# Patient Record
Sex: Male | Born: 1955 | Race: White | Hispanic: No | Marital: Single | Smoking: Never smoker
Health system: Southern US, Community
[De-identification: ages and names within clinical notes are randomized; demographics above are authoritative.]

## PROBLEM LIST (undated history)

## (undated) DIAGNOSIS — M069 Rheumatoid arthritis, unspecified: Secondary | ICD-10-CM

---

## 2010-06-16 ENCOUNTER — Ambulatory Visit: Payer: Self-pay

## 2012-08-17 IMAGING — CR DG KNEE 1-2V*R*
1 series · 2 of 2 positions shown · non-contrast
Comparison: none

REASON FOR EXAM: use of equipment Fax report to [REDACTED]
COMMENTS:

PROCEDURE:     DXR - DXR KNEE RIGHT AP AND LATERAL  - June 16, 2010  [DATE]
RESULT:     Images of the right knee demonstrate no evidence of fracture,
dislocation or radiopaque foreign body. Some mild medial and patellofemoral
joint space narrowing is present.

[Series 1: view not recorded · 0.17mm/px · 2 of 2 slices shown]
[im 1/2]
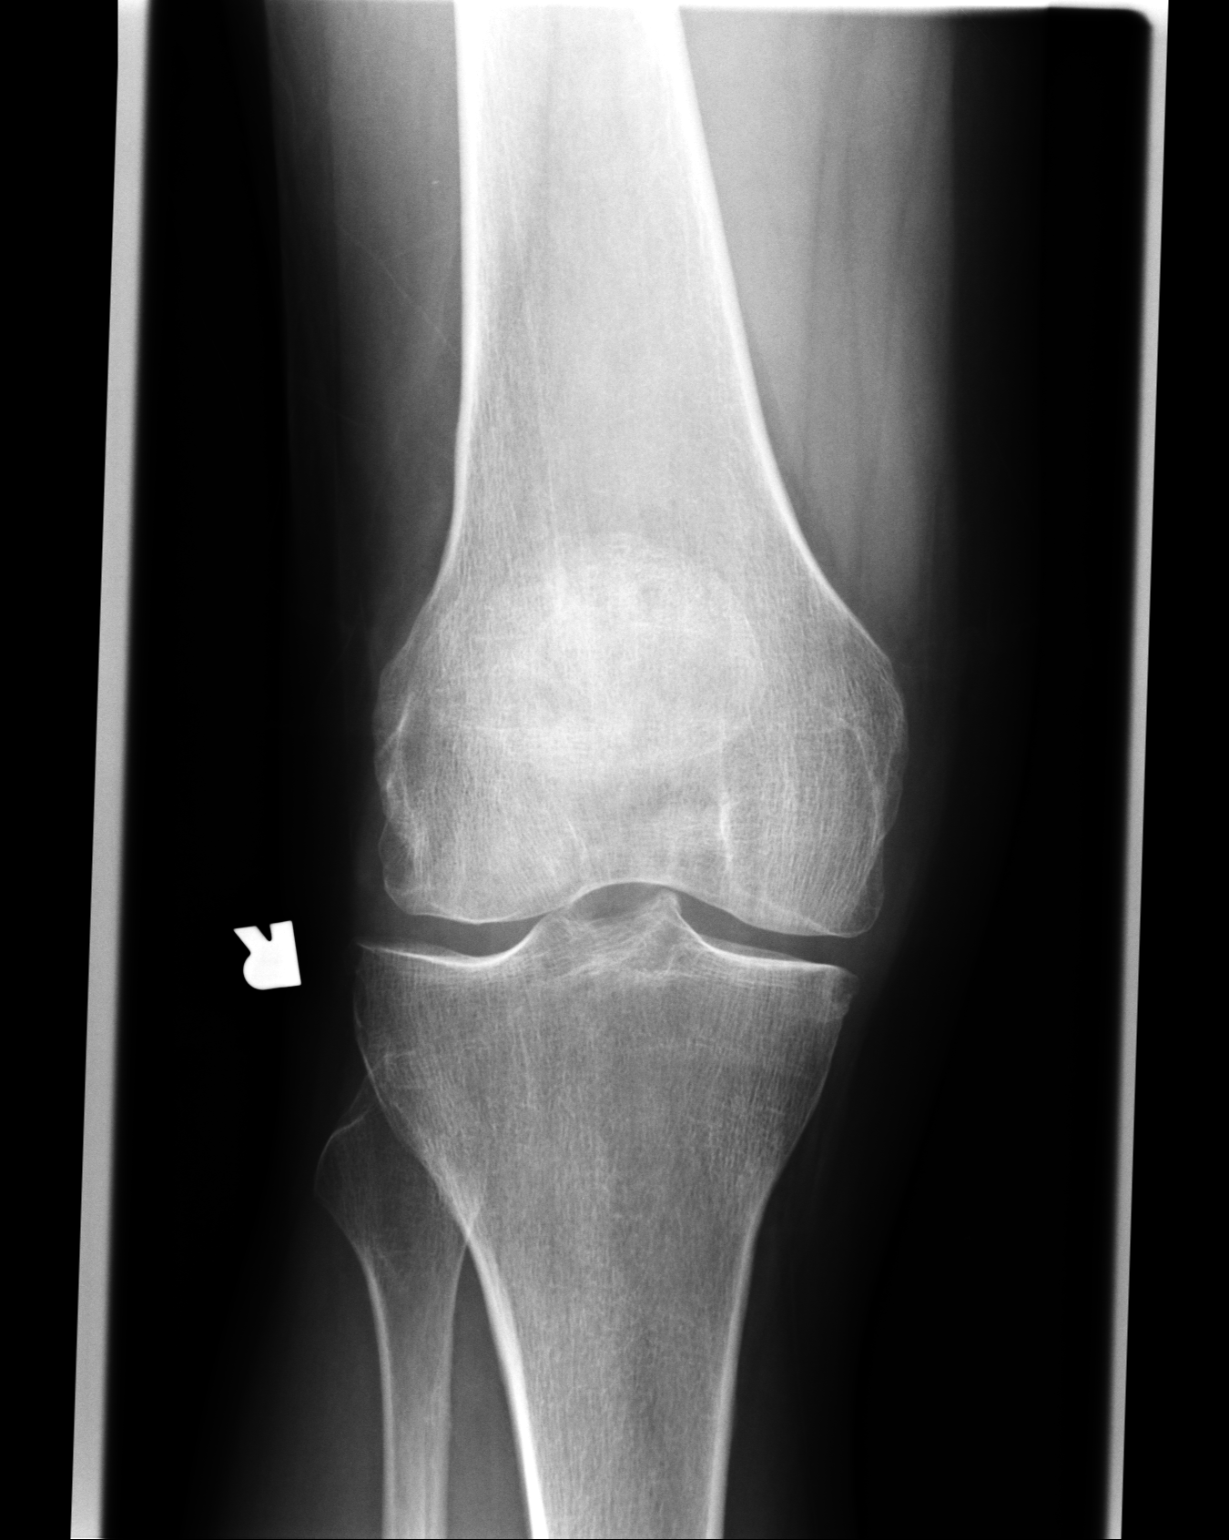
[im 2/2]
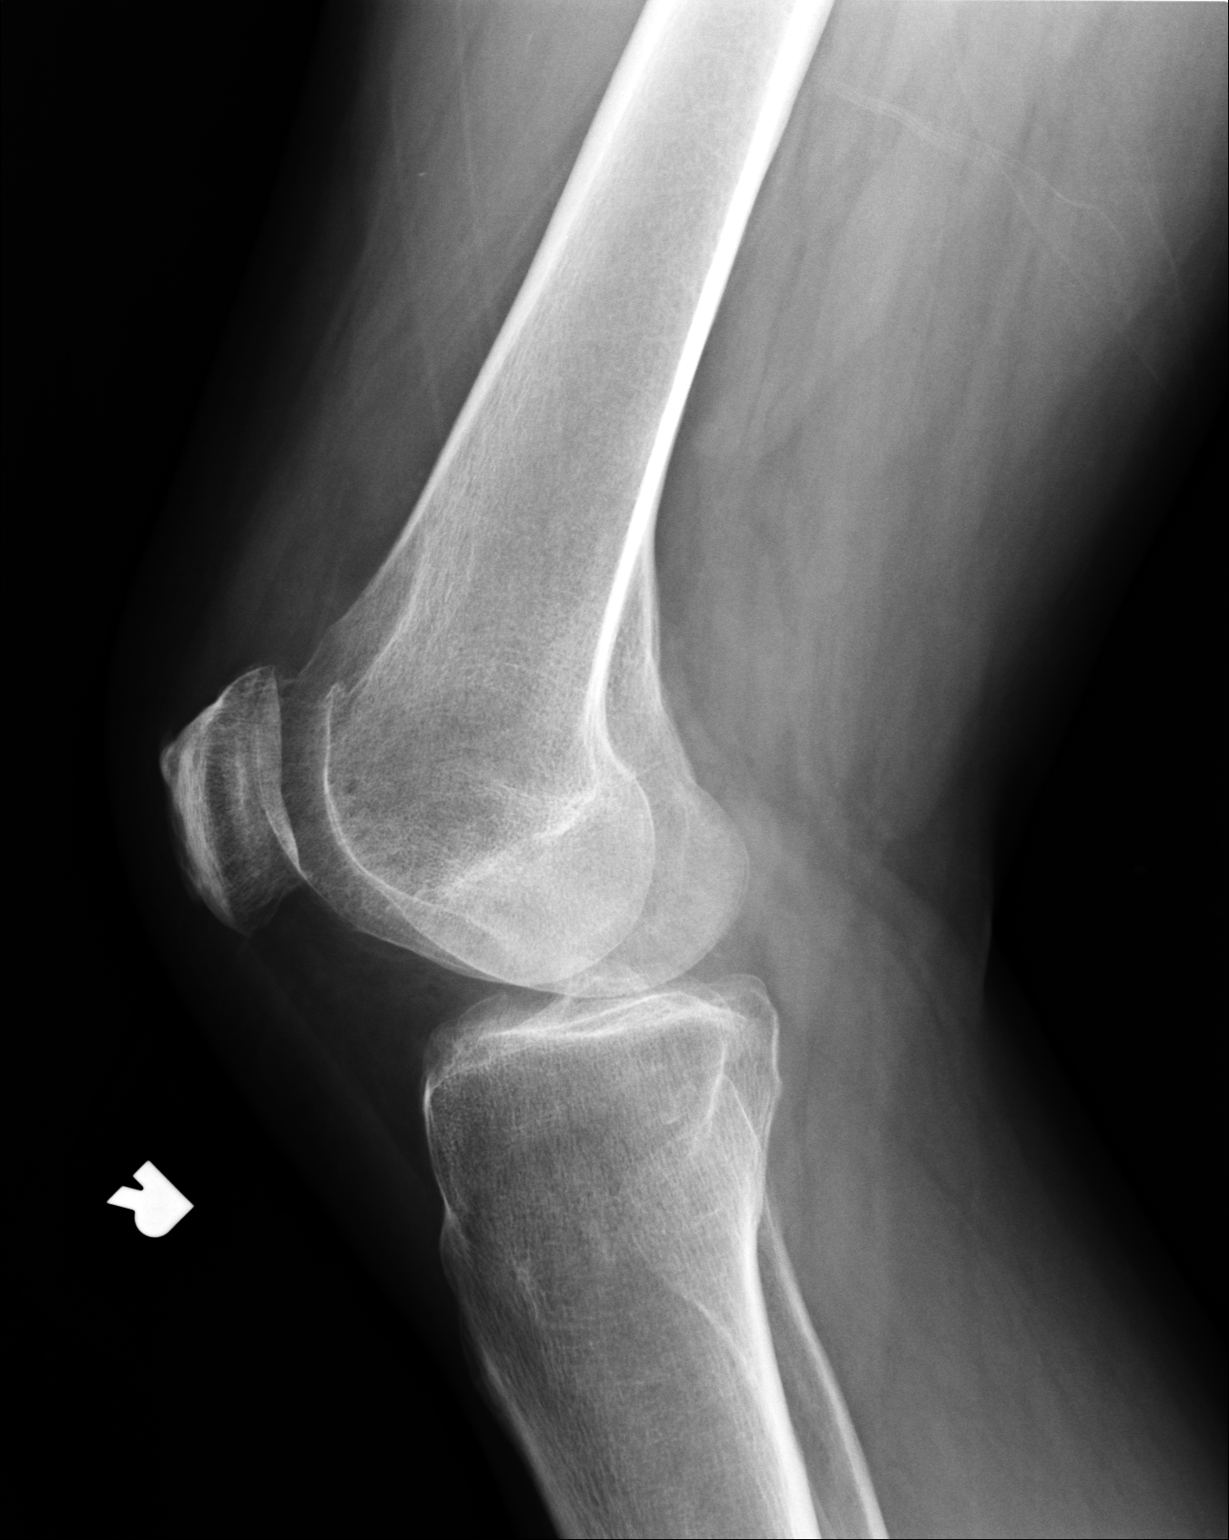

[2 of 2 positions shown; findings below may reference images not displayed]

IMPRESSION: Degenerative change. No acute bony abnormality evident.

## 2021-06-15 ENCOUNTER — Other Ambulatory Visit: Payer: Self-pay

## 2021-06-15 ENCOUNTER — Encounter (HOSPITAL_COMMUNITY): Payer: Self-pay | Admitting: *Deleted

## 2021-06-15 ENCOUNTER — Emergency Department (HOSPITAL_COMMUNITY)
Admission: EM | Admit: 2021-06-15 | Discharge: 2021-06-15 | Disposition: A | Discharge status: Home / Self Care | Payer: Medicare Other | Attending: Emergency Medicine | Admitting: Emergency Medicine

## 2021-06-15 ENCOUNTER — Emergency Department (HOSPITAL_COMMUNITY): Payer: Medicare Other

## 2021-06-15 DIAGNOSIS — Z20822 Contact with and (suspected) exposure to covid-19: Secondary | ICD-10-CM | POA: Diagnosis not present

## 2021-06-15 DIAGNOSIS — U071 COVID-19: Secondary | ICD-10-CM

## 2021-06-15 DIAGNOSIS — R109 Unspecified abdominal pain: Secondary | ICD-10-CM | POA: Insufficient documentation

## 2021-06-15 DIAGNOSIS — R059 Cough, unspecified: Secondary | ICD-10-CM | POA: Diagnosis present

## 2021-06-15 HISTORY — DX: Rheumatoid arthritis, unspecified: M06.9

## 2021-06-15 LAB — CBC WITH DIFFERENTIAL/PLATELET
Abs Immature Granulocytes: 0.01 10*3/uL (ref 0.00–0.07)
Basophils Absolute: 0 10*3/uL (ref 0.0–0.1)
Basophils Relative: 0 %
Eosinophils Absolute: 0 10*3/uL (ref 0.0–0.5)
Eosinophils Relative: 0 %
HCT: 44.3 % (ref 39.0–52.0)
Hemoglobin: 15.2 g/dL (ref 13.0–17.0)
Immature Granulocytes: 0 %
Lymphocytes Relative: 11 %
Lymphs Abs: 0.7 10*3/uL (ref 0.7–4.0)
MCH: 29.4 pg (ref 26.0–34.0)
MCHC: 34.3 g/dL (ref 30.0–36.0)
MCV: 85.7 fL (ref 80.0–100.0)
Monocytes Absolute: 0.5 10*3/uL (ref 0.1–1.0)
Monocytes Relative: 7 %
Neutro Abs: 5.1 10*3/uL (ref 1.7–7.7)
Neutrophils Relative %: 82 %
Platelets: 166 10*3/uL (ref 150–400)
RBC: 5.17 MIL/uL (ref 4.22–5.81)
RDW: 14 % (ref 11.5–15.5)
WBC: 6.2 10*3/uL (ref 4.0–10.5)
nRBC: 0.3 % — ABNORMAL HIGH (ref 0.0–0.2)

## 2021-06-15 LAB — LIPASE, BLOOD: Lipase: 22 U/L (ref 11–51)

## 2021-06-15 LAB — COMPREHENSIVE METABOLIC PANEL
ALT: 23 U/L (ref 0–44)
AST: 21 U/L (ref 15–41)
Albumin: 4.7 g/dL (ref 3.5–5.0)
Alkaline Phosphatase: 96 U/L (ref 38–126)
Anion gap: 11 (ref 5–15)
BUN: 11 mg/dL (ref 8–23)
CO2: 28 mmol/L (ref 22–32)
Calcium: 9.1 mg/dL (ref 8.9–10.3)
Chloride: 98 mmol/L (ref 98–111)
Creatinine, Ser: 0.66 mg/dL (ref 0.61–1.24)
GFR, Estimated: >60 mL/min (ref >60)
Glucose, Bld: 195 mg/dL — ABNORMAL HIGH (ref 70–99)
Potassium: 3.7 mmol/L (ref 3.5–5.1)
Sodium: 137 mmol/L (ref 135–145)
Total Bilirubin: 0.8 mg/dL (ref 0.3–1.2)
Total Protein: 8 g/dL (ref 6.5–8.1)

## 2021-06-15 LAB — GROUP A STREP BY PCR: Group A Strep by PCR: NOT DETECTED

## 2021-06-15 LAB — RESP PANEL BY RT-PCR (FLU A&B, COVID) ARPGX2
Influenza A by PCR: NEGATIVE
Influenza B by PCR: NEGATIVE
SARS Coronavirus 2 by RT PCR: POSITIVE — AB

## 2021-06-15 MED ORDER — ONDANSETRON HCL 4 MG/2ML IJ SOLN
4.0000 mg | Freq: Once | INTRAMUSCULAR | Status: AC
  Administered 2021-06-15: 10:00:00 4 mg via INTRAVENOUS
  Filled 2021-06-15: qty 2

## 2021-06-15 MED ORDER — NIRMATRELVIR/RITONAVIR (PAXLOVID)TABLET: 3.0000 | ORAL_TABLET | Freq: Two times a day (BID) | ORAL | 0 refills | Status: AC

## 2021-06-15 MED ORDER — HYDROMORPHONE HCL 1 MG/ML IJ SOLN
1.0000 mg | Freq: Once | INTRAMUSCULAR | Status: AC
  Administered 2021-06-15: 10:00:00 1 mg via INTRAVENOUS
  Filled 2021-06-15: qty 1

## 2021-06-15 MED ORDER — SODIUM CHLORIDE 0.9 % IV BOLUS
1000.0000 mL | Freq: Once | INTRAVENOUS | Status: AC
  Administered 2021-06-15: 10:00:00 1000 mL via INTRAVENOUS

## 2021-06-15 NOTE — Discharge Instructions (Signed)
You were seen in the emergency department for evaluation of headache body aches cough sore throat.  You tested positive for COVID.  Your flu and strep test were negative.  Your chest x-ray did not show any obvious pneumonia and your blood work did not show significant abnormalities.  We are prescribing you monoclonal antibody which may help lessen your symptoms.  Please follow-up with your regular doctor and return to the emergency department if any worsening or concerning symptoms

## 2021-06-15 NOTE — ED Triage Notes (Signed)
Pt c/o abdominal pain, back pain, fever, productive cough, runny nose, sore throat x couple of days.

## 2021-06-15 NOTE — ED Provider Notes (Signed)
[facility] EMERGENCY DEPARTMENT Provider Note   CSN: [id number] Arrival date & time: 06/15/21  [id number]     History Chief Complaint  Patient presents with   Cough    Matthew Riddle is a 65 y.o. male.  He is here with complaint of feeling sick for a couple of days.  Headache sore throat back and abdominal pain cough runny nose decreased appetite.  Feeling hot and cold.  No sick contacts or recent travel.  History of rheumatoid arthritis on chronic pain medicine.  The history is provided by the patient.  Cough Cough characteristics:  Productive Sputum characteristics:  Yellow Severity:  Moderate Onset quality:  Gradual Duration:  2 days Timing:  Intermittent Progression:  Unchanged Chronicity:  New Smoker: no   Relieved by:  None tried Worsened by:  Nothing Ineffective treatments:  None tried Associated symptoms: chills, fever, headaches, myalgias, rhinorrhea, shortness of breath and sore throat   Associated symptoms: no chest pain and no rash   Risk factors: no recent infection and no recent travel       Past Medical History:  Diagnosis Date   Rheumatoid arthritis ([place])     There are no problems to display for this patient.   Past Surgical History:  Procedure Laterality Date   FINGER FRACTURE SURGERY Left    fourth finger       No family history on file.  Social History   Tobacco Use   Smoking status: Never   Smokeless tobacco: Never  Vaping Use   Vaping Use: Never used  Substance Use Topics   Alcohol use: Yes    Comment: occasionally    Home Medications Prior to Admission medications   Not on File    Allergies    Penicillins  Review of Systems   Review of Systems  Constitutional:  Positive for chills and fever.  HENT:  Positive for rhinorrhea and sore throat.   Eyes:  Negative for visual disturbance.  Respiratory:  Positive for cough and shortness of breath.   Cardiovascular:  Negative for chest pain.  Gastrointestinal:  Positive for  abdominal pain and nausea. Negative for diarrhea and vomiting.  Genitourinary:  Negative for dysuria.  Musculoskeletal:  Positive for back pain and myalgias.  Skin:  Negative for rash.  Neurological:  Positive for headaches.  All other systems reviewed and are negative.  Physical Exam Updated Vital Signs BP (!) 149/78    Pulse 84    Resp 17    Ht 6' (1.829 m)    Wt 99.8 kg    SpO2 100%    BMI 29.84 kg/m   Physical Exam Vitals and nursing note reviewed.  Constitutional:      General: He is not in acute distress.    Appearance: Normal appearance. He is well-developed.  HENT:     Head: Normocephalic and atraumatic.  Eyes:     Conjunctiva/sclera: Conjunctivae normal.  Cardiovascular:     Rate and Rhythm: Normal rate and regular rhythm.     Heart sounds: No murmur heard. Pulmonary:     Effort: Pulmonary effort is normal. No respiratory distress.     Breath sounds: Normal breath sounds.  Abdominal:     Palpations: Abdomen is soft.     Tenderness: There is no abdominal tenderness. There is no guarding or rebound.  Musculoskeletal:        General: No swelling.     Cervical back: Neck supple.     Right lower leg: No edema.  Left lower leg: No edema.  Skin:    General: Skin is warm and dry.     Capillary Refill: Capillary refill takes less than 2 seconds.  Neurological:     General: No focal deficit present.     Mental Status: He is alert.  Psychiatric:        Mood and Affect: Mood normal.    ED Results / Procedures / Treatments   Labs (all labs ordered are listed, but only abnormal results are displayed) Labs Reviewed  RESP PANEL BY RT-PCR (FLU A&B, COVID) ARPGX2 - Abnormal; Notable for the following components:      Result Value   SARS Coronavirus 2 by RT PCR POSITIVE (*)    All other components within normal limits  COMPREHENSIVE METABOLIC PANEL - Abnormal; Notable for the following components:   Glucose, Bld 195 (*)    All other components within normal limits   CBC WITH DIFFERENTIAL/PLATELET - Abnormal; Notable for the following components:   nRBC 0.3 (*)    All other components within normal limits  GROUP A STREP BY PCR  LIPASE, BLOOD    EKG EKG Interpretation  Date/Time:  Sunday June 15 2021 10:16:28 EST Ventricular Rate:  85 PR Interval:  143 QRS Duration: 111 QT Interval:  388 QTC Calculation: 462 R Axis:   -64 Text Interpretation: Sinus rhythm Left anterior fascicular block Abnormal R-wave progression, early transition Left ventricular hypertrophy No old tracing to compare Confirmed by Aletta Edouard [phone]) on 06/15/2021 10:21:02 AM  Radiology DG Chest Port 1 View  Result Date: 06/15/2021 CLINICAL DATA:  Cough, abdominal pain, back pain.  Fever. EXAM: PORTABLE CHEST 1 VIEW COMPARISON:  None. FINDINGS: The heart size and mediastinal contours are within normal limits. Both lungs are clear. The visualized skeletal structures are unremarkable. IMPRESSION: No focal consolidation or pleural effusion. Electronically Signed   By: Keane Police D.O.   On: 06/15/2021 10:51    Procedures Procedures   Medications Ordered in ED Medications  sodium chloride 0.9 % bolus 1,000 mL (has no administration in time range)  ondansetron (ZOFRAN) injection 4 mg (has no administration in time range)  HYDROmorphone (DILAUDID) injection 1 mg (has no administration in time range)    ED Course  I have reviewed the triage vital signs and the nursing notes.  Pertinent labs & imaging results that were available during my care of the patient were reviewed by me and considered in my medical decision making (see chart for details).  Clinical Course as of 06/15/21 1726  Sun Jun 15, 2021  1043 Chest x-ray interpreted by me as no clear infiltrate.  Awaiting radiology reading. [MB]  1148 Reviewed patient's home medications with pharmacist who did not identify any contraindications to treating patient with Paxlovid [MB]    Clinical Course User Index [MB]  Hayden Rasmussen, MD   MDM Rules/Calculators/A&P                         Matthew Riddle was evaluated in Emergency Department on 06/15/2021 for the symptoms described in the history of present illness. He was evaluated in the context of the global COVID-19 pandemic, which necessitated consideration that the patient might be at risk for infection with the SARS-CoV-2 virus that causes COVID-19. Institutional protocols and algorithms that pertain to the evaluation of patients at risk for COVID-19 are in a state of rapid change based on information released by regulatory bodies including the CDC and federal  and state organizations. These policies and algorithms were followed during the patient's care in the ED.  This patient complains of headache body aches sore throat cough decreased appetite abdominal pain; this involves an extensive number of treatment Options and is a complaint that carries with it a high risk of complications and Morbidity. The differential includes COVID, flu, pneumonia, bronchitis, dehydration, strep throat  I ordered, reviewed and interpreted labs, which included CBC with normal white count normal hemoglobin, chemistries normal other than elevated glucose, LFTs normal, strep negative, COVID-positive flu negative I ordered medication IV fluids IV pain medication and nausea medication I ordered imaging studies which included chest x-ray and I independently    visualized and interpreted imaging which showed no acute findings Additional history obtained from patient's family member Previous records obtained and reviewed in epic no recent admissions I consulted pharmacy and discussed lab and imaging findings  Critical Interventions: None  After the interventions stated above, I reevaluated the patient and found patient to be symptomatically improved.  He is otherwise hemodynamically stable and does not require admission at this time.  Will prescribe paxlovid.  Return  instructions discussed     Final Clinical Impression(s) / ED Diagnoses Final diagnoses:  COVID-19 virus infection    Rx / DC Orders ED Discharge Orders     None        Terrilee Files, MD 06/15/21 1728

## 2023-08-17 IMAGING — DX DG CHEST 1V PORT
1 series · 1 of 1 positions shown · non-contrast
Comparison: None.

CLINICAL DATA: Cough, abdominal pain, back pain.  Fever.

EXAM:
PORTABLE CHEST 1 VIEW

[chest ap]
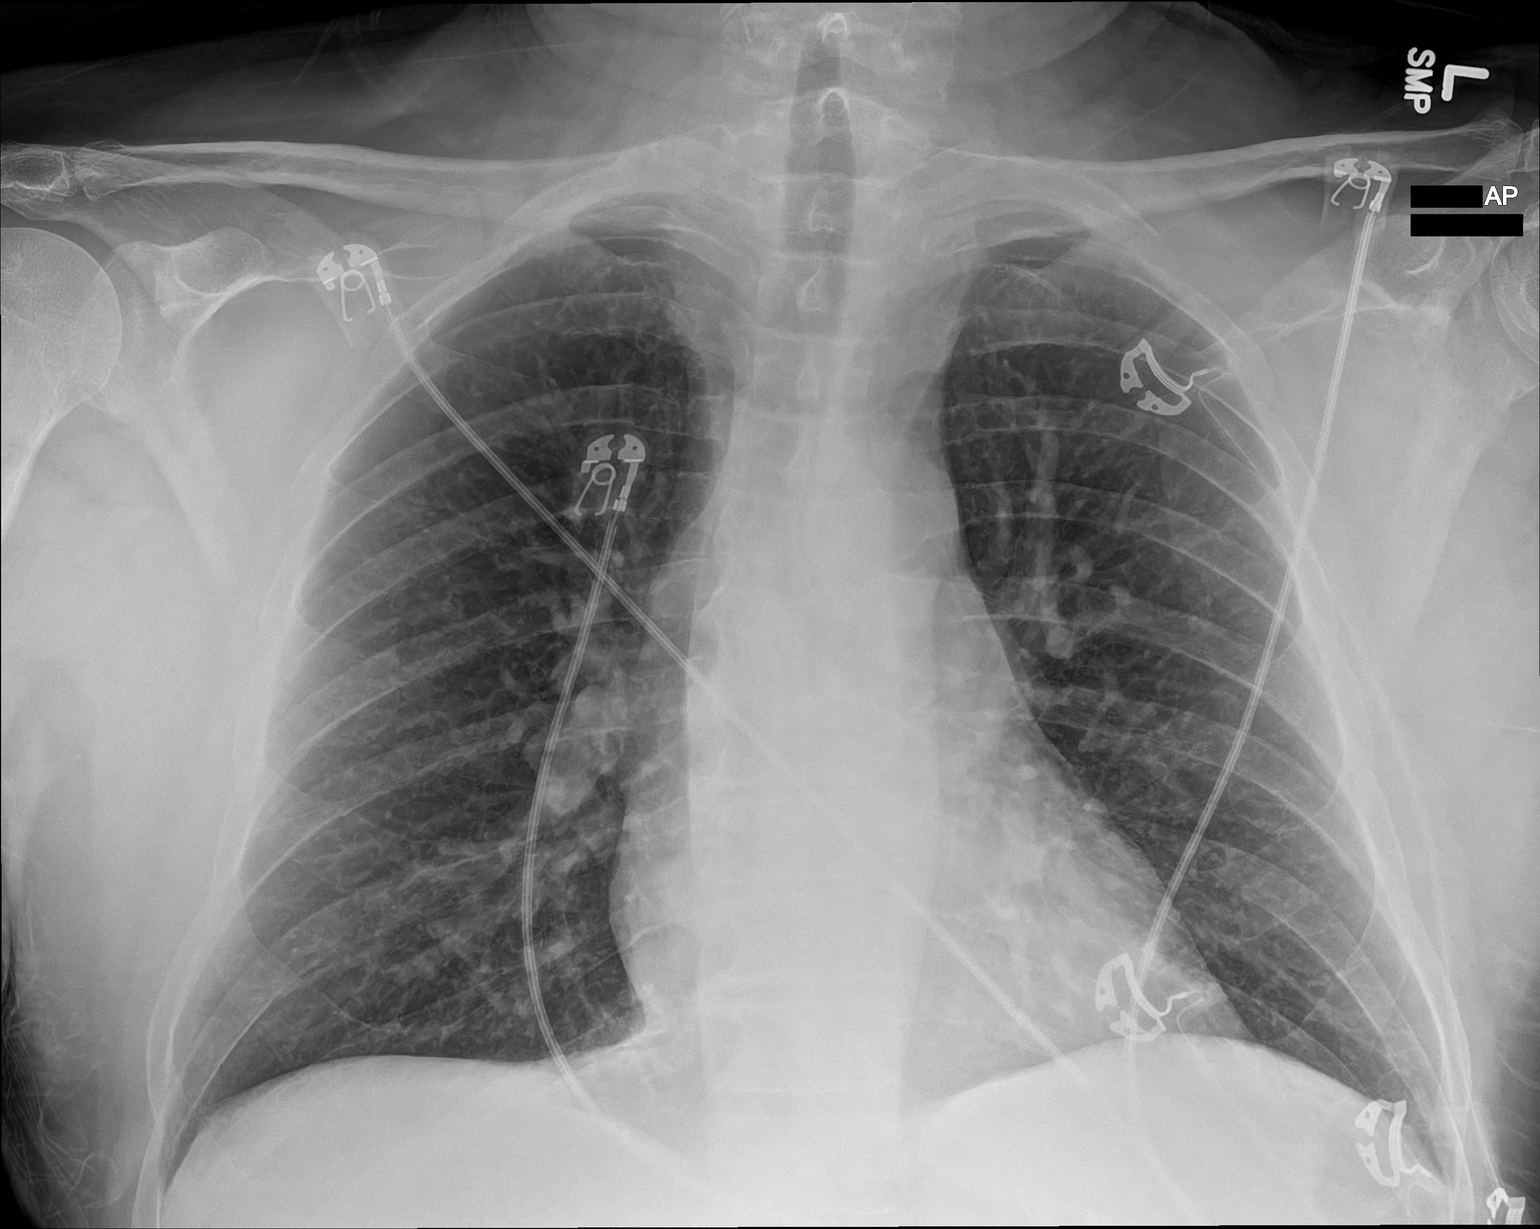

[1 of 1 positions shown; findings below may reference images not displayed]

FINDINGS: The heart size and mediastinal contours are within normal limits.
Both lungs are clear. The visualized skeletal structures are
unremarkable.
IMPRESSION: No focal consolidation or pleural effusion.
